# Patient Record
Sex: Female | Born: 1935 | Race: White | Hispanic: No | Marital: Married | Smoking: Never smoker
Health system: Southern US, Community
[De-identification: ages and names within clinical notes are randomized; demographics above are authoritative.]

## PROBLEM LIST (undated history)

## (undated) DIAGNOSIS — N189 Chronic kidney disease, unspecified: Secondary | ICD-10-CM

## (undated) DIAGNOSIS — K219 Gastro-esophageal reflux disease without esophagitis: Secondary | ICD-10-CM

## (undated) DIAGNOSIS — M1990 Unspecified osteoarthritis, unspecified site: Secondary | ICD-10-CM

## (undated) DIAGNOSIS — I10 Essential (primary) hypertension: Secondary | ICD-10-CM

## (undated) HISTORY — PX: CHOLECYSTECTOMY: SHX55

## (undated) HISTORY — PX: CATARACT EXTRACTION W/ INTRAOCULAR LENS  IMPLANT, BILATERAL: SHX1307

## (undated) HISTORY — PX: BACK SURGERY: SHX140

## (undated) HISTORY — PX: ABDOMINAL HYSTERECTOMY: SHX81

---

## 2004-11-18 ENCOUNTER — Ambulatory Visit (HOSPITAL_COMMUNITY): Admission: RE | Admit: 2004-11-18 | Discharge: 2004-11-19 | Payer: Self-pay | Admitting: Ophthalmology

## 2010-11-28 ENCOUNTER — Other Ambulatory Visit (HOSPITAL_COMMUNITY): Payer: Self-pay | Admitting: Neurosurgery

## 2010-11-28 ENCOUNTER — Encounter (HOSPITAL_COMMUNITY)
Admission: RE | Admit: 2010-11-28 | Discharge: 2010-11-28 | Disposition: A | Discharge status: Home / Self Care | Payer: Medicare Other | Source: Ambulatory Visit | Attending: Neurosurgery | Admitting: Neurosurgery

## 2010-11-28 ENCOUNTER — Ambulatory Visit (HOSPITAL_COMMUNITY)
Admission: RE | Admit: 2010-11-28 | Discharge: 2010-11-28 | Disposition: A | Discharge status: Home / Self Care | Payer: Medicare Other | Source: Ambulatory Visit | Attending: Neurosurgery | Admitting: Neurosurgery

## 2010-11-28 DIAGNOSIS — Z01810 Encounter for preprocedural cardiovascular examination: Secondary | ICD-10-CM | POA: Insufficient documentation

## 2010-11-28 DIAGNOSIS — Z01811 Encounter for preprocedural respiratory examination: Secondary | ICD-10-CM

## 2010-11-28 DIAGNOSIS — Z01818 Encounter for other preprocedural examination: Secondary | ICD-10-CM | POA: Insufficient documentation

## 2010-11-28 DIAGNOSIS — Z01812 Encounter for preprocedural laboratory examination: Secondary | ICD-10-CM | POA: Insufficient documentation

## 2010-11-28 LAB — SURGICAL PCR SCREEN: Staphylococcus aureus: NEGATIVE

## 2010-11-28 LAB — CBC
Platelets: 230 10*3/uL (ref 150–400)
RBC: 4.44 MIL/uL (ref 3.87–5.11)
RDW: 12.9 % (ref 11.5–15.5)
WBC: 9 10*3/uL (ref 4.0–10.5)

## 2010-11-28 LAB — ABO/RH: ABO/RH(D): O NEG

## 2010-11-28 LAB — BASIC METABOLIC PANEL
Chloride: 104 mEq/L (ref 96–112)
GFR calc Af Amer: 50 mL/min — ABNORMAL LOW (ref >60)
GFR calc non Af Amer: 41 mL/min — ABNORMAL LOW (ref >60)
Potassium: 3.5 mEq/L (ref 3.5–5.1)
Sodium: 138 mEq/L (ref 135–145)

## 2010-11-28 LAB — TYPE AND SCREEN
ABO/RH(D): O NEG
Antibody Screen: NEGATIVE

## 2010-12-02 ENCOUNTER — Inpatient Hospital Stay (HOSPITAL_COMMUNITY)
Admission: RE | Admit: 2010-12-02 | Discharge: 2010-12-05 | DRG: 460 | Disposition: A | Discharge status: Home / Self Care | Payer: Medicare Other | Source: Ambulatory Visit | Attending: Neurosurgery | Admitting: Neurosurgery

## 2010-12-02 ENCOUNTER — Inpatient Hospital Stay (HOSPITAL_COMMUNITY): Payer: Medicare Other

## 2010-12-02 DIAGNOSIS — S22009A Unspecified fracture of unspecified thoracic vertebra, initial encounter for closed fracture: Principal | ICD-10-CM | POA: Diagnosis present

## 2010-12-02 DIAGNOSIS — D649 Anemia, unspecified: Secondary | ICD-10-CM | POA: Diagnosis present

## 2010-12-02 DIAGNOSIS — X58XXXA Exposure to other specified factors, initial encounter: Secondary | ICD-10-CM | POA: Diagnosis present

## 2010-12-02 DIAGNOSIS — K219 Gastro-esophageal reflux disease without esophagitis: Secondary | ICD-10-CM | POA: Diagnosis present

## 2010-12-02 DIAGNOSIS — I10 Essential (primary) hypertension: Secondary | ICD-10-CM | POA: Diagnosis present

## 2010-12-02 DIAGNOSIS — Z8673 Personal history of transient ischemic attack (TIA), and cerebral infarction without residual deficits: Secondary | ICD-10-CM

## 2010-12-02 DIAGNOSIS — M129 Arthropathy, unspecified: Secondary | ICD-10-CM | POA: Diagnosis present

## 2010-12-11 NOTE — Op Note (Signed)
NAMEPRESLEY, GORA                ACCOUNT NO.:  [id number]  MEDICAL RECORD NO.:  [id number]           PATIENT TYPE:  I  LOCATION:  3035                         FACILITY:  [facility]  PHYSICIAN:  Reinaldo Meeker, M.D. DATE OF BIRTH:  [birth year]  DATE OF PROCEDURE:  12/02/2010 DATE OF DISCHARGE:                              OPERATIVE REPORT   PREOPERATIVE DIAGNOSIS:  Burst fracture T12 with retropulsion and stenosis, status post vertebroplasty.  POSTOPERATIVE DIAGNOSIS:  Burst fracture T12 with retropulsion and stenosis, status post vertebroplasty.  PROCEDURE:  T11-T12 decompressive laminectomy followed by T10, T11, L2 and L3 bilateral pedicle screw fixation followed by T10-L3 posterolateral fusion.  SURGEON:  Reinaldo Meeker, M.D.  ASSISTANT:  Donalee Citrin, M.D.  PROCEDURE IN DETAIL:  And placing in prone position, the patient's back was prepped and draped in usual sterile fashion.  Incision was made over the thoracolumbar junction, carried down the spinous processes. Subperiosteal dissection was then carried out bilaterally on the spinous processes and lamina and to the far-lateral region to identify transverse processes of the upper lumbar region and the rib heads of the lower thoracic region.  Fluoroscopy was brought into the field to identify the appropriate levels, and exposure was carried out at T10, T11, and T12 and L1, L2, and L3.  We then placed pedicle screws in T10 and T11 and L2 and L3 bilaterally.  We used AP fluoroscopy to choose entry points and passed the pedicle awl down through the pedicle without difficulty.  At T10, T11, and L3, we placed five 5 mm screws of a 45 mm length and tapped with the four 5-mm tap.  At L2, we tapped with a 3.75- mm tap and placed four 5 x 45-mm screw.  We then decompressed T11-12 in the midline and to the edge of the medial pedicles bilaterally until a good spinal decompression was carried out.  Large amounts of irrigation carried out  at this time.  High-speed drill was then used to decorticate the transverse processes and far-lateral region with rib heads, lateral facet joint.  I then did a posterolateral fusion along that area by placing a mixture of BMP, EquivaBone, and autologous bone which had been saved from the decompression.  We then used a template and chose an appropriate length rod.  We then probed in standard fashion and found it to be an excellent fit.  We then secured it with top loading nuts.  We did tightening and final tightening bilaterally.  At this time, large amounts of irrigation were carried out once more.  Gelfoam was placed over the dural exposure.  Fluoroscopy in AP and lateral directions showed excellent placement of screws and rods.  A cross-link was placed prior to placing our final shots.  Two epidural drains were left in the epidural space and brought out through separate stab wound incisions.  The wounds were then closed in multiple layers of Vicryl on the muscle fascia, subcutaneous and subcuticular tissues, and staples were placed on the skin.  Sterile dressing was then applied.  The patient was extubated, taken to the recovery room in stable condition.  ______________________________ Reinaldo Meeker, M.D.     ROK/MEDQ  D:  12/02/2010  T:  12/03/2010  Job:  [id number]  Electronically Signed by Aliene Beams M.D. on 12/11/2010 06:16:47 PM

## 2011-01-16 ENCOUNTER — Other Ambulatory Visit: Payer: Self-pay | Admitting: Neurosurgery

## 2011-01-16 ENCOUNTER — Ambulatory Visit
Admission: RE | Admit: 2011-01-16 | Discharge: 2011-01-16 | Disposition: A | Discharge status: Home / Self Care | Payer: Medicare Other | Source: Ambulatory Visit | Attending: Neurosurgery | Admitting: Neurosurgery

## 2011-01-16 DIAGNOSIS — M545 Low back pain: Secondary | ICD-10-CM

## 2011-01-16 DIAGNOSIS — S22009A Unspecified fracture of unspecified thoracic vertebra, initial encounter for closed fracture: Secondary | ICD-10-CM

## 2011-01-16 DIAGNOSIS — M549 Dorsalgia, unspecified: Secondary | ICD-10-CM

## 2011-01-21 ENCOUNTER — Other Ambulatory Visit: Payer: Self-pay | Admitting: Neurosurgery

## 2011-01-21 DIAGNOSIS — M545 Low back pain: Secondary | ICD-10-CM

## 2011-01-21 DIAGNOSIS — M549 Dorsalgia, unspecified: Secondary | ICD-10-CM

## 2011-01-23 ENCOUNTER — Ambulatory Visit
Admission: RE | Admit: 2011-01-23 | Discharge: 2011-01-23 | Disposition: A | Discharge status: Home / Self Care | Payer: Medicare Other | Source: Ambulatory Visit | Attending: Neurosurgery | Admitting: Neurosurgery

## 2011-01-23 ENCOUNTER — Other Ambulatory Visit: Payer: Medicare Other

## 2011-01-23 DIAGNOSIS — M549 Dorsalgia, unspecified: Secondary | ICD-10-CM

## 2011-01-23 DIAGNOSIS — M545 Low back pain: Secondary | ICD-10-CM

## 2011-02-19 NOTE — Discharge Summary (Signed)
  Sheila Hart, Sheila Hart                ACCOUNT NO.:  [id number]  MEDICAL RECORD NO.:  [id number]           PATIENT TYPE:  I  LOCATION:  3035                         FACILITY:  [facility]  PHYSICIAN:  Reinaldo Meeker, M.D. DATE OF BIRTH:  May 28, 1933  DATE OF ADMISSION:  12/02/2010 DATE OF DISCHARGE:  12/05/2010                              DISCHARGE SUMMARY   PRIMARY DIAGNOSIS:  Burst fracture T12 with stenosis.  PRIMARY OPERATIVE PROCEDURE:  T11-12 decompressive laminectomy followed by T10, T11, L2, and L3 bilateral pedicle screw fixation with T10-L3 posterolateral fusion.  HISTORY:  Ms. Woollard is a 77 year old female with severe back and bilateral lower extremity pain.  An MRI scan shows a burst fracture with retropulsion at T12 subsequent stenosis.  She is now admitted for a T11- 12 decompression with pedicle screw fixation.  She underwent the operation on December 02, 2010, and tolerated without difficulty.  She was able to increase her activity slowly and steadily.  By December 05, 2010, she was up in the halls ambulating well.  She has felt much better than she did before surgery.  She will be discharged home.  Discharge medications include pain medicine.  Her condition was markedly improved versus admission.          ______________________________ Reinaldo Meeker, M.D.     ROK/MEDQ  D:  01/13/2011  T:  01/14/2011  Job:  [id number]  Electronically Signed by Aliene Beams M.D. on 02/19/2011 05:00:56 PM

## 2011-02-23 ENCOUNTER — Ambulatory Visit
Admission: RE | Admit: 2011-02-23 | Discharge: 2011-02-23 | Disposition: A | Discharge status: Home / Self Care | Payer: Medicare Other | Source: Ambulatory Visit | Attending: Neurosurgery | Admitting: Neurosurgery

## 2011-02-23 ENCOUNTER — Other Ambulatory Visit: Payer: Self-pay | Admitting: Neurosurgery

## 2011-02-23 DIAGNOSIS — M549 Dorsalgia, unspecified: Secondary | ICD-10-CM

## 2011-02-23 DIAGNOSIS — M545 Low back pain: Secondary | ICD-10-CM

## 2011-06-15 ENCOUNTER — Other Ambulatory Visit: Payer: Self-pay | Admitting: Neurosurgery

## 2011-06-15 ENCOUNTER — Ambulatory Visit
Admission: RE | Admit: 2011-06-15 | Discharge: 2011-06-15 | Disposition: A | Discharge status: Home / Self Care | Payer: Medicare Other | Source: Ambulatory Visit | Attending: Neurosurgery | Admitting: Neurosurgery

## 2011-06-15 DIAGNOSIS — S22009A Unspecified fracture of unspecified thoracic vertebra, initial encounter for closed fracture: Secondary | ICD-10-CM

## 2011-06-15 DIAGNOSIS — M4800 Spinal stenosis, site unspecified: Secondary | ICD-10-CM

## 2011-06-17 ENCOUNTER — Ambulatory Visit
Admission: RE | Admit: 2011-06-17 | Discharge: 2011-06-17 | Disposition: A | Discharge status: Home / Self Care | Payer: Medicare Other | Source: Ambulatory Visit | Attending: Neurosurgery | Admitting: Neurosurgery

## 2011-06-17 DIAGNOSIS — M4800 Spinal stenosis, site unspecified: Secondary | ICD-10-CM

## 2011-06-17 MED ORDER — MEPERIDINE HCL 100 MG/ML IJ SOLN
75.0000 mg | Freq: Once | INTRAMUSCULAR | Status: AC
  Administered 2011-06-17: 75 mg via INTRAMUSCULAR

## 2011-06-17 MED ORDER — DIAZEPAM 2 MG PO TABS
5.0000 mg | ORAL_TABLET | Freq: Once | ORAL | Status: AC
  Administered 2011-06-17: 5 mg via ORAL

## 2011-06-17 MED ORDER — ONDANSETRON HCL 4 MG/2ML IJ SOLN
4.0000 mg | Freq: Once | INTRAMUSCULAR | Status: AC
  Administered 2011-06-17: 4 mg via INTRAMUSCULAR

## 2011-06-17 MED ORDER — IOHEXOL 180 MG/ML  SOLN
16.0000 mL | Freq: Once | INTRAMUSCULAR | Status: AC | PRN
  Administered 2011-06-17: 16 mL via INTRATHECAL

## 2011-07-17 ENCOUNTER — Encounter (HOSPITAL_COMMUNITY)
Admission: RE | Admit: 2011-07-17 | Discharge: 2011-07-17 | Disposition: A | Discharge status: Home / Self Care | Payer: Medicare Other | Source: Ambulatory Visit | Attending: Neurosurgery | Admitting: Neurosurgery

## 2011-07-17 LAB — CBC
HCT: 39.2 % (ref 36.0–46.0)
Hemoglobin: 12.8 g/dL (ref 12.0–15.0)
MCV: 87.7 fL (ref 78.0–100.0)
RBC: 4.47 MIL/uL (ref 3.87–5.11)
WBC: 6.2 10*3/uL (ref 4.0–10.5)

## 2011-07-17 LAB — BASIC METABOLIC PANEL
BUN: 22 mg/dL (ref 6–23)
CO2: 30 mEq/L (ref 19–32)
Chloride: 105 mEq/L (ref 96–112)
Creatinine, Ser: 1.23 mg/dL — ABNORMAL HIGH (ref 0.50–1.10)
Glucose, Bld: 99 mg/dL (ref 70–99)
Potassium: 4.2 mEq/L (ref 3.5–5.1)

## 2011-07-21 ENCOUNTER — Observation Stay (HOSPITAL_COMMUNITY)
Admission: RE | Admit: 2011-07-21 | Discharge: 2011-07-22 | Disposition: A | Discharge status: Home / Self Care | Payer: Medicare Other | Source: Ambulatory Visit | Attending: Neurosurgery | Admitting: Neurosurgery

## 2011-07-21 ENCOUNTER — Observation Stay (HOSPITAL_COMMUNITY): Payer: Medicare Other

## 2011-07-21 DIAGNOSIS — I10 Essential (primary) hypertension: Secondary | ICD-10-CM | POA: Insufficient documentation

## 2011-07-21 DIAGNOSIS — M47817 Spondylosis without myelopathy or radiculopathy, lumbosacral region: Principal | ICD-10-CM | POA: Insufficient documentation

## 2011-07-21 DIAGNOSIS — M5126 Other intervertebral disc displacement, lumbar region: Secondary | ICD-10-CM | POA: Insufficient documentation

## 2011-07-21 DIAGNOSIS — Z01812 Encounter for preprocedural laboratory examination: Secondary | ICD-10-CM | POA: Insufficient documentation

## 2011-07-21 DIAGNOSIS — Z8673 Personal history of transient ischemic attack (TIA), and cerebral infarction without residual deficits: Secondary | ICD-10-CM | POA: Insufficient documentation

## 2011-08-24 ENCOUNTER — Other Ambulatory Visit: Payer: Self-pay | Admitting: Neurosurgery

## 2011-08-24 DIAGNOSIS — M545 Low back pain, unspecified: Secondary | ICD-10-CM

## 2011-08-27 ENCOUNTER — Ambulatory Visit
Admission: RE | Admit: 2011-08-27 | Discharge: 2011-08-27 | Disposition: A | Discharge status: Home / Self Care | Payer: Medicare Other | Source: Ambulatory Visit | Attending: Neurosurgery | Admitting: Neurosurgery

## 2011-08-27 DIAGNOSIS — M545 Low back pain: Secondary | ICD-10-CM

## 2011-08-27 MED ORDER — GADOBENATE DIMEGLUMINE 529 MG/ML IV SOLN: 14.0000 mL | Freq: Once | INTRAVENOUS | Status: AC | PRN

## 2011-11-09 ENCOUNTER — Other Ambulatory Visit: Payer: Self-pay | Admitting: Neurosurgery

## 2011-11-09 DIAGNOSIS — M545 Low back pain: Secondary | ICD-10-CM

## 2011-11-11 ENCOUNTER — Ambulatory Visit
Admission: RE | Admit: 2011-11-11 | Discharge: 2011-11-11 | Disposition: A | Discharge status: Home / Self Care | Payer: Medicare Other | Source: Ambulatory Visit | Attending: Neurosurgery | Admitting: Neurosurgery

## 2011-11-11 VITALS — BP 159/82 | HR 75 | Ht 61.0 in | Wt 156.0 lb

## 2011-11-11 DIAGNOSIS — M545 Low back pain: Secondary | ICD-10-CM

## 2011-11-11 MED ORDER — DIAZEPAM 5 MG PO TABS
5.0000 mg | ORAL_TABLET | Freq: Once | ORAL | Status: AC
  Administered 2011-11-11: 5 mg via ORAL

## 2011-11-11 NOTE — Progress Notes (Signed)
Pt has been off sertraline for the past 2 days.

## 2011-11-11 NOTE — Discharge Instructions (Signed)
Myelogram  Discharge Instructions  1. Go home and rest quietly for the next 24 hours.  It is important to lie flat for the next 24 hours.  Get up only to go to the restroom.  You may lie in the bed or on a couch on your back, your stomach, your left side or your right side.  You may have one pillow under your head.  You may have pillows between your knees while you are on your side or under your knees while you are on your back.  2. DO NOT drive today.  Recline the seat as far back as it will go, while still wearing your seat belt, on the way home.  3. You may get up to go to the bathroom as needed.  You may sit up for 10 minutes to eat.  You may resume your normal diet and medications unless otherwise indicated.  Drink lots of extra fluids today and tomorrow.  4. The incidence of headache, nausea, or vomiting is about 5% (one in 20 patients).  If you develop a headache, lie flat and drink plenty of fluids until the headache goes away.  Caffeinated beverages may be helpful.  If you develop severe nausea and vomiting or a headache that does not go away with flat bed rest, call [phone].  5. You may resume normal activities after your 24 hours of bed rest is over; however, do not exert yourself strongly or do any heavy lifting tomorrow.  6. Call your physician for a follow-up appointment.  The results of your myelogram will be sent directly to your physician by the following day.  7. If you have any questions or if complications develop after you arrive home, please call [phone].  Discharge instructions have been explained to the patient.  The patient, or the person responsible for the patient, fully understands these instructions.   May resume sertraline on November 12, 2011, after 12:30 pm.

## 2012-02-25 ENCOUNTER — Encounter (HOSPITAL_COMMUNITY)
Admission: RE | Admit: 2012-02-25 | Discharge: 2012-02-25 | Disposition: A | Discharge status: Home / Self Care | Payer: Medicare Other | Source: Ambulatory Visit | Attending: Neurosurgery | Admitting: Neurosurgery

## 2012-02-25 ENCOUNTER — Ambulatory Visit (HOSPITAL_COMMUNITY)
Admission: RE | Admit: 2012-02-25 | Discharge: 2012-02-25 | Disposition: A | Discharge status: Home / Self Care | Payer: Medicare Other | Source: Ambulatory Visit | Attending: Neurosurgery | Admitting: Neurosurgery

## 2012-02-25 ENCOUNTER — Encounter (HOSPITAL_COMMUNITY): Payer: Self-pay | Admitting: Pharmacy Technician

## 2012-02-25 ENCOUNTER — Encounter (HOSPITAL_COMMUNITY): Payer: Self-pay

## 2012-02-25 DIAGNOSIS — Z01812 Encounter for preprocedural laboratory examination: Secondary | ICD-10-CM | POA: Insufficient documentation

## 2012-02-25 DIAGNOSIS — Z01818 Encounter for other preprocedural examination: Secondary | ICD-10-CM | POA: Insufficient documentation

## 2012-02-25 DIAGNOSIS — I10 Essential (primary) hypertension: Secondary | ICD-10-CM | POA: Insufficient documentation

## 2012-02-25 HISTORY — DX: Unspecified osteoarthritis, unspecified site: M19.90

## 2012-02-25 HISTORY — DX: Essential (primary) hypertension: I10

## 2012-02-25 HISTORY — DX: Gastro-esophageal reflux disease without esophagitis: K21.9

## 2012-02-25 HISTORY — DX: Chronic kidney disease, unspecified: N18.9

## 2012-02-25 LAB — BASIC METABOLIC PANEL
CO2: 29 mEq/L (ref 19–32)
Chloride: 102 mEq/L (ref 96–112)
Glucose, Bld: 112 mg/dL — ABNORMAL HIGH (ref 70–99)
Sodium: 141 mEq/L (ref 135–145)

## 2012-02-25 LAB — CBC
Hemoglobin: 13.6 g/dL (ref 12.0–15.0)
MCV: 90.6 fL (ref 78.0–100.0)
Platelets: 202 10*3/uL (ref 150–400)
RBC: 4.56 MIL/uL (ref 3.87–5.11)
WBC: 6.6 10*3/uL (ref 4.0–10.5)

## 2012-02-25 LAB — SURGICAL PCR SCREEN: MRSA, PCR: NEGATIVE

## 2012-02-25 NOTE — Progress Notes (Signed)
CALLED DR KRITZER'S OFFICE FOR PREOP ORDERS.

## 2012-02-25 NOTE — Pre-Procedure Instructions (Signed)
20 Sheila Hart  02/25/2012   Your procedure is scheduled on:  Tuesday 03/01/12   Report to [facility] at 530 AM.  Call this number if you have problems the morning of surgery: [phone]   Remember:   Do not eat food:After Midnight.  May have clear liquids: up to 4 Hours before arrival.(UNTIL 130AM)  Clear liquids include soda, tea, black coffee, apple or grape juice, broth.  Take these medicines the morning of surgery with A SIP OF WATER:  POTASSIUM , ZOLOFT   Do not wear jewelry, make-up or nail polish.  Do not wear lotions, powders, or perfumes. You may wear deodorant.  Do not shave 48 hours prior to surgery. Men may shave face and neck.  Do not bring valuables to the hospital.  Contacts, dentures or bridgework may not be worn into surgery.  Leave suitcase in the car. After surgery it may be brought to your room.  For patients admitted to the hospital, checkout time is 11:00 AM the day of discharge.   Patients discharged the day of surgery will not be allowed to drive home.  Name and phone number of your driver:  Special Instructions: CHG Shower Use Special Wash: 1/2 bottle night before surgery and 1/2 bottle morning of surgery.   Please read over the following fact sheets that you were given: Pain Booklet, Coughing and Deep Breathing, MRSA Information and Surgical Site Infection Prevention

## 2012-02-26 NOTE — Progress Notes (Signed)
Called [facility] Noe about orders not being signed yet and needing a consent order entered in EPIC.

## 2012-02-29 NOTE — Progress Notes (Signed)
Called [facility] and left message on voice mail for Shanda Bumps that pt's orders need to be signed.

## 2012-03-01 ENCOUNTER — Encounter (HOSPITAL_COMMUNITY): Payer: Self-pay | Admitting: Anesthesiology

## 2012-03-01 ENCOUNTER — Ambulatory Visit (HOSPITAL_COMMUNITY): Payer: Medicare Other

## 2012-03-01 ENCOUNTER — Encounter (HOSPITAL_COMMUNITY): Payer: Self-pay

## 2012-03-01 ENCOUNTER — Encounter (HOSPITAL_COMMUNITY)
Admission: RE | Disposition: A | Discharge status: Home / Self Care | Payer: Self-pay | Source: Ambulatory Visit | Attending: Neurosurgery

## 2012-03-01 ENCOUNTER — Inpatient Hospital Stay (HOSPITAL_COMMUNITY)
Admission: RE | Admit: 2012-03-01 | Discharge: 2012-03-02 | DRG: 491 | Disposition: A | Discharge status: Home / Self Care | Payer: Medicare Other | Source: Ambulatory Visit | Attending: Neurosurgery | Admitting: Neurosurgery

## 2012-03-01 ENCOUNTER — Ambulatory Visit (HOSPITAL_COMMUNITY): Payer: Medicare Other | Admitting: Anesthesiology

## 2012-03-01 DIAGNOSIS — N189 Chronic kidney disease, unspecified: Secondary | ICD-10-CM | POA: Diagnosis present

## 2012-03-01 DIAGNOSIS — M47817 Spondylosis without myelopathy or radiculopathy, lumbosacral region: Principal | ICD-10-CM | POA: Diagnosis present

## 2012-03-01 DIAGNOSIS — Z981 Arthrodesis status: Secondary | ICD-10-CM

## 2012-03-01 DIAGNOSIS — I129 Hypertensive chronic kidney disease with stage 1 through stage 4 chronic kidney disease, or unspecified chronic kidney disease: Secondary | ICD-10-CM | POA: Diagnosis present

## 2012-03-01 DIAGNOSIS — M549 Dorsalgia, unspecified: Secondary | ICD-10-CM

## 2012-03-01 HISTORY — PX: HARDWARE REMOVAL: SHX979

## 2012-03-01 SURGERY — REMOVAL, HARDWARE
Anesthesia: General

## 2012-03-01 MED ORDER — TRIAMTERENE-HCTZ 37.5-25 MG PO TABS
1.0000 | ORAL_TABLET | Freq: Every day | ORAL | Status: DC
  Administered 2012-03-01 – 2012-03-02 (×2): 1 via ORAL
  Filled 2012-03-01 (×2): qty 1

## 2012-03-01 MED ORDER — GLYCOPYRROLATE 0.2 MG/ML IJ SOLN
INTRAMUSCULAR | Status: DC | PRN
  Administered 2012-03-01: 0.4 mg via INTRAVENOUS
  Administered 2012-03-01: 0.3 mg via INTRAVENOUS

## 2012-03-01 MED ORDER — HYDROMORPHONE HCL PF 1 MG/ML IJ SOLN
INTRAMUSCULAR | Status: AC
  Filled 2012-03-01: qty 1

## 2012-03-01 MED ORDER — SODIUM CHLORIDE 0.9 % IV SOLN
INTRAVENOUS | Status: AC
  Filled 2012-03-01: qty 500

## 2012-03-01 MED ORDER — HYDROMORPHONE HCL PF 1 MG/ML IJ SOLN: 1.0000 mg | INTRAMUSCULAR | Status: DC | PRN

## 2012-03-01 MED ORDER — VANCOMYCIN HCL 500 MG IV SOLR
500.0000 mg | Freq: Once | INTRAVENOUS | Status: DC
  Filled 2012-03-01: qty 500

## 2012-03-01 MED ORDER — BACITRACIN ZINC 500 UNIT/GM EX OINT
TOPICAL_OINTMENT | CUTANEOUS | Status: DC | PRN
  Administered 2012-03-01: 1 via TOPICAL

## 2012-03-01 MED ORDER — SODIUM CHLORIDE 0.9 % IJ SOLN: 3.0000 mL | Freq: Two times a day (BID) | INTRAMUSCULAR | Status: DC

## 2012-03-01 MED ORDER — ACETAMINOPHEN 10 MG/ML IV SOLN: 1000.0000 mg | Freq: Four times a day (QID) | INTRAVENOUS | Status: DC

## 2012-03-01 MED ORDER — ACETAMINOPHEN 325 MG PO TABS: 650.0000 mg | ORAL_TABLET | ORAL | Status: DC | PRN

## 2012-03-01 MED ORDER — FENTANYL CITRATE 0.05 MG/ML IJ SOLN
INTRAMUSCULAR | Status: DC | PRN
  Administered 2012-03-01 (×2): 50 ug via INTRAVENOUS
  Administered 2012-03-01: 100 ug via INTRAVENOUS

## 2012-03-01 MED ORDER — EPHEDRINE SULFATE 50 MG/ML IJ SOLN
INTRAMUSCULAR | Status: DC | PRN
  Administered 2012-03-01 (×5): 5 mg via INTRAVENOUS

## 2012-03-01 MED ORDER — POTASSIUM CHLORIDE CRYS ER 10 MEQ PO TBCR
10.0000 meq | EXTENDED_RELEASE_TABLET | Freq: Every day | ORAL | Status: DC
  Administered 2012-03-01 – 2012-03-02 (×2): 10 meq via ORAL
  Filled 2012-03-01 (×2): qty 1

## 2012-03-01 MED ORDER — MIDAZOLAM HCL 2 MG/2ML IJ SOLN
INTRAMUSCULAR | Status: AC
  Administered 2012-03-01: 1 mg
  Filled 2012-03-01: qty 2

## 2012-03-01 MED ORDER — BACITRACIN 50000 UNITS IM SOLR
INTRAMUSCULAR | Status: AC
  Filled 2012-03-01: qty 1

## 2012-03-01 MED ORDER — HEMOSTATIC AGENTS (NO CHARGE) OPTIME
TOPICAL | Status: DC | PRN
  Administered 2012-03-01: 1 via TOPICAL

## 2012-03-01 MED ORDER — LIDOCAINE HCL (CARDIAC) 20 MG/ML IV SOLN
INTRAVENOUS | Status: DC | PRN
  Administered 2012-03-01: 40 mg via INTRAVENOUS

## 2012-03-01 MED ORDER — LACTATED RINGERS IV SOLN
INTRAVENOUS | Status: DC | PRN
  Administered 2012-03-01 (×3): via INTRAVENOUS

## 2012-03-01 MED ORDER — VANCOMYCIN HCL 1000 MG IV SOLR
750.0000 mg | Freq: Two times a day (BID) | INTRAVENOUS | Status: DC
  Administered 2012-03-01 – 2012-03-02 (×2): 750 mg via INTRAVENOUS
  Filled 2012-03-01 (×3): qty 750

## 2012-03-01 MED ORDER — ONDANSETRON HCL 4 MG/2ML IJ SOLN: 4.0000 mg | INTRAMUSCULAR | Status: DC | PRN

## 2012-03-01 MED ORDER — ACETAMINOPHEN 650 MG RE SUPP: 650.0000 mg | RECTAL | Status: DC | PRN

## 2012-03-01 MED ORDER — SERTRALINE HCL 100 MG PO TABS
100.0000 mg | ORAL_TABLET | Freq: Every day | ORAL | Status: DC
  Administered 2012-03-01 – 2012-03-02 (×2): 100 mg via ORAL
  Filled 2012-03-01 (×2): qty 1

## 2012-03-01 MED ORDER — ONDANSETRON HCL 4 MG/2ML IJ SOLN
INTRAMUSCULAR | Status: DC | PRN
  Administered 2012-03-01: 4 mg via INTRAVENOUS

## 2012-03-01 MED ORDER — SODIUM CHLORIDE 0.9 % IR SOLN
Status: DC | PRN
  Administered 2012-03-01: 08:00:00

## 2012-03-01 MED ORDER — HYDROMORPHONE HCL PF 1 MG/ML IJ SOLN
0.2500 mg | INTRAMUSCULAR | Status: DC | PRN
  Administered 2012-03-01 (×4): 0.5 mg via INTRAVENOUS

## 2012-03-01 MED ORDER — PHENOL 1.4 % MT LIQD: 1.0000 | OROMUCOSAL | Status: DC | PRN

## 2012-03-01 MED ORDER — KCL IN DEXTROSE-NACL 20-5-0.45 MEQ/L-%-% IV SOLN
80.0000 mL/h | INTRAVENOUS | Status: DC
  Administered 2012-03-01: 80 mL/h via INTRAVENOUS
  Filled 2012-03-01 (×3): qty 1000

## 2012-03-01 MED ORDER — NEOSTIGMINE METHYLSULFATE 1 MG/ML IJ SOLN
INTRAMUSCULAR | Status: DC | PRN
  Administered 2012-03-01: 2 mg via INTRAVENOUS
  Administered 2012-03-01: 3 mg via INTRAVENOUS

## 2012-03-01 MED ORDER — ONDANSETRON HCL 4 MG/2ML IJ SOLN: 4.0000 mg | Freq: Once | INTRAMUSCULAR | Status: DC | PRN

## 2012-03-01 MED ORDER — ROCURONIUM BROMIDE 100 MG/10ML IV SOLN
INTRAVENOUS | Status: DC | PRN
  Administered 2012-03-01: 50 mg via INTRAVENOUS

## 2012-03-01 MED ORDER — 0.9 % SODIUM CHLORIDE (POUR BTL) OPTIME
TOPICAL | Status: DC | PRN
  Administered 2012-03-01: 1000 mL

## 2012-03-01 MED ORDER — MENTHOL 3 MG MT LOZG: 1.0000 | LOZENGE | OROMUCOSAL | Status: DC | PRN

## 2012-03-01 MED ORDER — VANCOMYCIN HCL 1000 MG IV SOLR
1000.0000 mg | INTRAVENOUS | Status: DC | PRN
  Administered 2012-03-01: 500 mg via INTRAVENOUS

## 2012-03-01 MED ORDER — THROMBIN 5000 UNITS EX SOLR
CUTANEOUS | Status: DC | PRN
  Administered 2012-03-01 (×2): 5000 [IU] via TOPICAL

## 2012-03-01 MED ORDER — SODIUM CHLORIDE 0.9 % IV SOLN: 250.0000 mL | INTRAVENOUS | Status: DC

## 2012-03-01 MED ORDER — MIDAZOLAM HCL 2 MG/2ML IJ SOLN: 1.0000 mg | Freq: Once | INTRAMUSCULAR | Status: DC

## 2012-03-01 MED ORDER — SODIUM CHLORIDE 0.9 % IV SOLN
INTRAVENOUS | Status: DC | PRN
  Administered 2012-03-01: 08:00:00 via INTRAVENOUS

## 2012-03-01 MED ORDER — HYDROCODONE-ACETAMINOPHEN 5-325 MG PO TABS
1.0000 | ORAL_TABLET | ORAL | Status: DC | PRN
  Administered 2012-03-01 – 2012-03-02 (×3): 2 via ORAL
  Filled 2012-03-01 (×3): qty 2

## 2012-03-01 MED ORDER — ACETAMINOPHEN 10 MG/ML IV SOLN
INTRAVENOUS | Status: AC
  Administered 2012-03-01: 1000 mg
  Filled 2012-03-01: qty 100

## 2012-03-01 MED ORDER — SODIUM CHLORIDE 0.9 % IJ SOLN: 3.0000 mL | INTRAMUSCULAR | Status: DC | PRN

## 2012-03-01 MED ORDER — PROPOFOL 10 MG/ML IV EMUL
INTRAVENOUS | Status: DC | PRN
  Administered 2012-03-01: 120 mg via INTRAVENOUS

## 2012-03-01 SURGICAL SUPPLY — 44 items
BAG DECANTER FOR FLEXI CONT (MISCELLANEOUS) ×2 IMPLANT
BENZOIN TINCTURE PRP APPL 2/3 (GAUZE/BANDAGES/DRESSINGS) ×2 IMPLANT
BLADE SURG ROTATE 9660 (MISCELLANEOUS) ×2 IMPLANT
BRUSH SCRUB EZ PLAIN DRY (MISCELLANEOUS) ×2 IMPLANT
CANISTER SUCTION 2500CC (MISCELLANEOUS) ×2 IMPLANT
CLOTH BEACON ORANGE TIMEOUT ST (SAFETY) ×2 IMPLANT
CONT SPEC 4OZ CLIKSEAL STRL BL (MISCELLANEOUS) ×2 IMPLANT
DERMABOND ADVANCED (GAUZE/BANDAGES/DRESSINGS) ×1
DERMABOND ADVANCED .7 DNX12 (GAUZE/BANDAGES/DRESSINGS) ×1 IMPLANT
DRAPE LAPAROTOMY 100X72X124 (DRAPES) ×2 IMPLANT
DRAPE MICROSCOPE ZEISS OPMI (DRAPES) IMPLANT
DRAPE SURG 17X23 STRL (DRAPES) ×4 IMPLANT
DRESSING TELFA 8X3 (GAUZE/BANDAGES/DRESSINGS) ×2 IMPLANT
ELECT REM PT RETURN 9FT ADLT (ELECTROSURGICAL) ×2
ELECTRODE REM PT RTRN 9FT ADLT (ELECTROSURGICAL) ×1 IMPLANT
GAUZE SPONGE 4X4 16PLY XRAY LF (GAUZE/BANDAGES/DRESSINGS) IMPLANT
GLOVE BIOGEL PI IND STRL 7.0 (GLOVE) ×2 IMPLANT
GLOVE BIOGEL PI INDICATOR 7.0 (GLOVE) ×2
GLOVE ECLIPSE 7.5 STRL STRAW (GLOVE) ×2 IMPLANT
GLOVE INDICATOR 7.0 STRL GRN (GLOVE) ×2 IMPLANT
GOWN BRE IMP SLV AUR LG STRL (GOWN DISPOSABLE) ×2 IMPLANT
GOWN BRE IMP SLV AUR XL STRL (GOWN DISPOSABLE) ×4 IMPLANT
GOWN STRL REIN 2XL LVL4 (GOWN DISPOSABLE) IMPLANT
KIT BASIN OR (CUSTOM PROCEDURE TRAY) ×2 IMPLANT
KIT ROOM TURNOVER OR (KITS) ×2 IMPLANT
NEEDLE HYPO 22GX1.5 SAFETY (NEEDLE) ×2 IMPLANT
NEEDLE SPNL 22GX3.5 QUINCKE BK (NEEDLE) ×2 IMPLANT
NS IRRIG 1000ML POUR BTL (IV SOLUTION) ×2 IMPLANT
PACK LAMINECTOMY NEURO (CUSTOM PROCEDURE TRAY) ×2 IMPLANT
PAD ARMBOARD 7.5X6 YLW CONV (MISCELLANEOUS) ×6 IMPLANT
PATTIES SURGICAL .75X.75 (GAUZE/BANDAGES/DRESSINGS) ×2 IMPLANT
RUBBERBAND STERILE (MISCELLANEOUS) IMPLANT
SPONGE GAUZE 4X4 12PLY (GAUZE/BANDAGES/DRESSINGS) ×2 IMPLANT
SPONGE SURGIFOAM ABS GEL SZ50 (HEMOSTASIS) ×2 IMPLANT
STRIP CLOSURE SKIN 1/2X4 (GAUZE/BANDAGES/DRESSINGS) ×6 IMPLANT
SUT VIC AB 0 CT1 18XCR BRD8 (SUTURE) ×1 IMPLANT
SUT VIC AB 0 CT1 8-18 (SUTURE) ×1
SUT VIC AB 2-0 OS6 18 (SUTURE) ×8 IMPLANT
SUT VIC AB 3-0 CP2 18 (SUTURE) ×4 IMPLANT
SYR 20ML ECCENTRIC (SYRINGE) ×2 IMPLANT
TOOL FLUTED BALL 7MM (MISCELLANEOUS) ×2 IMPLANT
TOWEL OR 17X24 6PK STRL BLUE (TOWEL DISPOSABLE) ×2 IMPLANT
TOWEL OR 17X26 10 PK STRL BLUE (TOWEL DISPOSABLE) ×2 IMPLANT
WATER STERILE IRR 1000ML POUR (IV SOLUTION) ×2 IMPLANT

## 2012-03-01 NOTE — Preoperative (Signed)
Beta Blockers   Reason not to administer Beta Blockers:Not Applicable 

## 2012-03-01 NOTE — Op Note (Signed)
Preop diagnosis: Back pain status post spinal instrumentation and spondylosis L3-4 left with L4 nerve root compression Postop diagnosis: Same Procedure: Exploration of thoracolumbar fusion followed by removal of thoracolumbar pedicle screw instrumentation T9-L3 followed by left L3-4 intralaminar laminotomy for decompression of L4 nerve root Surgeon: Insurance risk surveyor: Botero  After and placed the prone position the patient's back was prepped and draped in the usual sterile fashion. Previous lumbar and thoracic incision were opened. It was taken down to the dorsal fascia and then the dorsal fascia was incised bilaterally over the spinal instrumentation. Once this was cleared of soft tissue and expose clearly the cross-link was removed. We then evaluated the fusion across the thoracolumbar junction and found to be solid. We then removed the locking caps rod and screws bilaterally without difficulty. We then did subperiosteal dissection on the left side of the spinous processes lamina and facet joint at L3-4 on the left. We identified the edges of the previous laminotomy and generously enlarged M. with a high-speed drill and bony punches. We remove soft tissue from the area until the spinal dura and L4 nerve were well visualized well decompressed. At this time large amounts of irrigation carried out and any bleeding control proper coagulation Gelfoam. An epidural drain was left in the epidural space and brought out through a separate stab wound incision. The was then closed in multiple layers of Vicryl on the muscle fascia subcutaneous and subcuticular tissues and Dermabond Steri-Strips were placed on the skin. A sterile dressing was applied and the patient was extubated and taken to recovery room in stable condition.

## 2012-03-01 NOTE — Progress Notes (Signed)
ANTIBIOTIC CONSULT NOTE - INITIAL  Pharmacy Consult for Vancomycin Indication: Surgical prophylaxis  Allergies  Allergen Reactions  . Avelox (Moxifloxacin Hcl In Nacl) Hives and Itching  . Cefaclor Itching and Rash  . Clindamycin/Lincomycin Hives and Rash  . Codeine Nausea And Vomiting  . Guaifenesin Hives and Itching  . Lodine (Etodolac) Nausea And Vomiting  . Lovastatin Other (See Comments)    Causes body aches  . Penicillins Hives and Rash  . Tequin Hives and Itching  . Verapamil Hcl Er Itching and Rash    Patient Measurements: Height: 5\' 1"  (154.9 cm) Weight: 167 lb 8 oz (75.978 kg) IBW/kg (Calculated) : 47.8   Vital Signs: Temp: 97.6 F (36.4 C) (06/11 1240) Temp src: Axillary (06/11 1240) BP: 128/80 mmHg (06/11 1240) Pulse Rate: 85  (06/11 1205) Intake/Output from previous day:   Intake/Output from this shift: Total I/O In: 2025 [I.V.:2025] Out: 250 [Drains:50; Blood:200]  Labs: No results found for this basename: WBC:3,HGB:3,PLT:3,LABCREA:3,CREATININE:3 in the last 72 hours Estimated Creatinine Clearance: 52.3 ml/min (by C-G formula based on Cr of 0.84). No results found for this basename: VANCOTROUGH:2,VANCOPEAK:2,VANCORANDOM:2,GENTTROUGH:2,GENTPEAK:2,GENTRANDOM:2,TOBRATROUGH:2,TOBRAPEAK:2,TOBRARND:2,AMIKACINPEAK:2,AMIKACINTROU:2,AMIKACIN:2, in the last 72 hours   Microbiology: Recent Results (from the past 720 hour(s))  SURGICAL PCR SCREEN     Status: Normal   Collection Time   02/25/12  3:12 PM      Component Value Range Status Comment   MRSA, PCR NEGATIVE  NEGATIVE  Final    Staphylococcus aureus NEGATIVE  NEGATIVE  Final     Medical History: Past Medical History  Diagnosis Date  . Hypertension   . Chronic kidney disease     HX KIDNEY STONES   . GERD (gastroesophageal reflux disease)   . Arthritis     OA    Assessment: 40 YOF s/p exploration of thoracolumbar fusion followed by hardware removal. Pharmacy is consulted to dose vancomycin for  surgical prophylaxis. Pt. Has a hemovac, scr 0.84. Est. crcl 50, received pre-op vancomycin 500mg  at 0800   Goal of Therapy:  Vancomycin trough level 15-20 mcg/ml  Plan:  - Vancomycin 750mg  IV Q 12hrs start 2000 - f/u renal function, drain status - Check vancomycin trough at steady state if continues  Bayard Hugger, PharmD, BCPS  Clinical Pharmacist  Pager: [phone]  03/01/2012,1:01 PM

## 2012-03-01 NOTE — Progress Notes (Signed)
UR COMPLETED  

## 2012-03-01 NOTE — Anesthesia Preprocedure Evaluation (Addendum)
Anesthesia Evaluation  Patient identified by MRN, date of birth, ID band Patient awake    Reviewed: Allergy & Precautions, H&P , NPO status , Patient's Chart, lab work & pertinent test results  Airway Mallampati: II TM Distance: >3 FB Neck ROM: Full    Dental  (+) Teeth Intact   Pulmonary neg pulmonary ROS,  breath sounds clear to auscultation        Cardiovascular hypertension, Pt. on medications Rhythm:Regular Rate:Normal     Neuro/Psych negative neurological ROS  negative psych ROS   GI/Hepatic Neg liver ROS, GERD-  Controlled,  Endo/Other  negative endocrine ROS  Renal/GU      Musculoskeletal negative musculoskeletal ROS (+)   Abdominal   Peds  Hematology negative hematology ROS (+)   Anesthesia Other Findings   Reproductive/Obstetrics                         Anesthesia Physical Anesthesia Plan  ASA: II  Anesthesia Plan: General   Post-op Pain Management:    Induction: Intravenous  Airway Management Planned: Oral ETT  Additional Equipment:   Intra-op Plan:   Post-operative Plan: Extubation in OR  Informed Consent: I have reviewed the patients History and Physical, chart, labs and discussed the procedure including the risks, benefits and alternatives for the proposed anesthesia with the patient or authorized representative who has indicated his/her understanding and acceptance.   Dental advisory given  Plan Discussed with: Anesthesiologist and Surgeon  Anesthesia Plan Comments: (Htn Mild obesity Painful retained hardware  Plan GA  Kipp Brood, MD)       Anesthesia Quick Evaluation

## 2012-03-01 NOTE — Brief Op Note (Signed)
03/01/2012  10:44 AM  PATIENT:  Sheila Hart  78 y.o. female  PRE-OPERATIVE DIAGNOSIS:  back pain  POST-OPERATIVE DIAGNOSIS:  back pain  PROCEDURE:  Procedure(s) (LRB): HARDWARE REMOVAL (N/A)  SURGEON:  Surgeon(s) and Role:    * Reinaldo Meeker, MD - Primary    * Karn Cassis, MD - Assisting  PHYSICIAN ASSISTANT:   ASSISTANTS: Botero   ANESTHESIA:   general  EBL:  Total I/O In: 2000 [I.V.:2000] Out: 200 [Blood:200]  BLOOD ADMINISTERED:none  DRAINS: (medium) Hemovact drain(s) in the epidural with  Suction Open   LOCAL MEDICATIONS USED:  NONE  SPECIMEN:  No Specimen  DISPOSITION OF SPECIMEN:  N/A  COUNTS:  YES  TOURNIQUET:  * No tourniquets in log *  DICTATION: .Dragon Dictation  PLAN OF CARE: Admit to inpatient   PATIENT DISPOSITION:  PACU - hemodynamically stable.   Delay start of Pharmacological VTE agent (>24hrs) due to surgical blood loss or risk of bleeding: yes

## 2012-03-01 NOTE — H&P (Signed)
Sheila Hart is an 78 y.o. female.   Chief Complaint: Back pain and left leg pain HPI: The patient is a 78 year old female who had a compression fracture at the thoracolumbar junction years ago. She was treated with a vertebroplasty in spite of gross instability and bone and the canal. When she came to see [place] to 7 severe back pain and underwent a long segment fusion for approximately T9-L3. She got marked improvement. Because of the weakness of her bone and the long segment of the fusion to the upper screws slowly worked backward in spite of her fusing nicely. She did develop some mild stenosis at L3-4 below the fusion and underwent a laminotomy last year. She initially did well but then developed some recurrent pain at that level. Been restudied and discussing the options it was elected to remove her hardware and some of it was pushing upward on the skin from underneath. We're also going to reevaluate the left L3-4 foramen. I had a long discussion with her regarding the risks and benefits of surgical intervention. Discussed include but are not limited to bleeding infection weakness numbness paralysis no improvement in her pain, and death. We have discussed alternative methods of therapy offered risks and benefits of nonintervention. She's had the opportunity numerous questions and appears to understand. With this information in hand she has requested that we proceed with surgery.  Past Medical History  Diagnosis Date  . Hypertension   . Chronic kidney disease     HX KIDNEY STONES   . GERD (gastroesophageal reflux disease)   . Arthritis     OA    Past Surgical History  Procedure Date  . Abdominal hysterectomy   . Cholecystectomy   . Cataract extraction w/ intraocular lens  implant, bilateral   . Back surgery     2011(BROKEN) ,  ???2012    No family history on file. Social History:  reports that she has never smoked. She has never used smokeless tobacco. She reports that she does not drink  alcohol or use illicit drugs.  Allergies:  Allergies  Allergen Reactions  . Avelox (Moxifloxacin Hcl In Nacl) Hives and Itching  . Cefaclor Itching and Rash  . Clindamycin/Lincomycin Hives and Rash  . Codeine Nausea And Vomiting  . Guaifenesin Hives and Itching  . Lodine (Etodolac) Nausea And Vomiting  . Lovastatin Other (See Comments)    Causes body aches  . Penicillins Hives and Rash  . Tequin Hives and Itching  . Verapamil Hcl Er Itching and Rash    Medications Prior to Admission  Medication Sig Dispense Refill  . calcium-vitamin D (OSCAL WITH D) 500-200 MG-UNIT per tablet Take 1 tablet by mouth daily.      . cholecalciferol (VITAMIN D) 1000 UNITS tablet Take 1,000 Units by mouth daily.      . potassium chloride (K-DUR,KLOR-CON) 10 MEQ tablet Take 10 mEq by mouth daily.      . sertraline (ZOLOFT) 100 MG tablet Take 100 mg by mouth daily.      Marland Kitchen triamterene-hydrochlorothiazide (MAXZIDE-25) 37.5-25 MG per tablet Take 1 tablet by mouth daily.      . vitamin B-12 (CYANOCOBALAMIN) 1000 MCG tablet Take 1,000 mcg by mouth daily.        No results found for this or any previous visit (from the past 48 hour(s)). No results found.  A comprehensive review of systems was negative.  Blood pressure 131/80, pulse 76, temperature 98.2 F (36.8 C), temperature source Oral, resp. rate 16,  SpO2 98.00%.  The patient is awake alert and oriented. His no facial asymmetry. Her gait is slow and deliberate. Reflexes are decreased but equal her strength is intact as is her sensation. Assessment/Plan Impression is that of back pain and left leg pain and the plan is for removal of her hardware expiration of the left L3-4 foramen.  Reinaldo Meeker, MD 03/01/2012, 7:37 AM

## 2012-03-01 NOTE — Anesthesia Procedure Notes (Signed)
Procedure Name: Intubation Date/Time: 03/01/2012 8:07 AM Performed by: Julianne Rice K Pre-anesthesia Checklist: Emergency Drugs available, Patient identified, Timeout performed, Suction available and Patient being monitored Patient Re-evaluated:Patient Re-evaluated prior to inductionOxygen Delivery Method: Circle system utilized Intubation Type: IV induction Ventilation: Mask ventilation without difficulty Laryngoscope Size: Miller and 2 Grade View: Grade II Tube type: Oral Tube size: 7.5 mm Number of attempts: 1 Airway Equipment and Method: Stylet Placement Confirmation: ETT inserted through vocal cords under direct vision,  positive ETCO2 and breath sounds checked- equal and bilateral Secured at: 22 cm Tube secured with: Tape Dental Injury: Teeth and Oropharynx as per pre-operative assessment

## 2012-03-01 NOTE — Progress Notes (Signed)
Paged Dr Gerlene Fee at [phone] as there are no orders for permit.  No response as of yet.  Will mark pt ready for surgery

## 2012-03-01 NOTE — Anesthesia Postprocedure Evaluation (Signed)
  Anesthesia Post-op Note  Patient: Sheila Hart  Procedure(s) Performed: Procedure(s) (LRB): HARDWARE REMOVAL (N/A)  Patient Location: PACU  Anesthesia Type: General  Level of Consciousness: awake, alert  and oriented  Airway and Oxygen Therapy: Patient Spontanous Breathing and Patient connected to nasal cannula oxygen  Post-op Pain: mild  Post-op Assessment: Post-op Vital signs reviewed and Patient's Cardiovascular Status Stable  Post-op Vital Signs: stable  Complications: No apparent anesthesia complications

## 2012-03-01 NOTE — Transfer of Care (Signed)
Immediate Anesthesia Transfer of Care Note  Patient: Sheila Hart  Procedure(s) Performed: Procedure(s) (LRB): HARDWARE REMOVAL (N/A)  Patient Location: PACU  Anesthesia Type: General  Level of Consciousness: awake, alert  and oriented  Airway & Oxygen Therapy: Patient Spontanous Breathing and Patient connected to nasal cannula oxygen  Post-op Assessment: Report given to PACU RN and Post -op Vital signs reviewed and stable  Post vital signs: Reviewed and stable  Complications: No apparent anesthesia complications

## 2012-03-02 MED ORDER — HYDROCODONE-ACETAMINOPHEN 5-325 MG PO TABS: 1.0000 | ORAL_TABLET | ORAL | Status: AC | PRN

## 2012-03-02 NOTE — Progress Notes (Signed)
PT was walked this morning prior to d/c walked down the circle and hall with a Guice, no distress. Pt was d/c with instructions. Patient stable

## 2012-03-02 NOTE — Care Management Note (Signed)
    Page 1 of 1   03/02/2012     10:30:54 AM   CARE MANAGEMENT NOTE 03/02/2012  Patient:  Sheila Hart, Sheila Hart   Account Number:  [id number]  Date Initiated:  03/02/2012  Documentation initiated by:  Onnie Boer  Subjective/Objective Assessment:   PT WAS ADMITTED FOR SURG     Action/Plan:   PROGRESSION OF CARE AND DISCHARGE PLANNING   Anticipated DC Date:  03/02/2012   Anticipated DC Plan:  HOME/SELF CARE      DC Planning Services  CM consult      Choice offered to / List presented to:             Status of service:  Completed, signed off Medicare Important Message given?   (If response is "NO", the following Medicare IM given date fields will be blank) Date Medicare IM given:   Date Additional Medicare IM given:    Discharge Disposition:  HOME/SELF CARE  Per UR Regulation:  Reviewed for med. necessity/level of care/duration of stay  If discussed at Long Length of Stay Meetings, dates discussed:    Comments:  03/02/12 Onnie Boer, RN, BSN 1029 PT WAS ADMITTED FOR HARDWEAR REMOVAL AND WAS DC'D TO HOME WITH SELF CARE

## 2012-03-02 NOTE — Discharge Summary (Signed)
Physician Discharge Summary  Patient ID: Sheila Hart MRN: [id number] DOB/AGE: [birth year] 76 y.o.  Admit date: 03/01/2012 Discharge date: 03/02/2012  Admission Diagnoses:  Discharge Diagnoses:  Active Problems:  * No active hospital problems. *    Discharged Condition: good  Hospital Course: Surgery one day, home the next. Pain markedly improved. Wound looks excellent. Instructions given.  Consults: None  Significant Diagnostic Studies: none  Treatments: surgery: Removal of hardware, Left L34 laminotomy  Discharge Exam: Blood pressure 111/70, pulse 74, temperature 97.6 F (36.4 C), temperature source Oral, resp. rate 20, height 5\' 1"  (1.549 m), weight 75.978 kg (167 lb 8 oz), SpO2 98.00%. Incision/Wound:healing well  Disposition: 01-Home or Self Care  Discharge Orders    Future Orders Please Complete By Expires   Diet general      Discharge instructions      Comments:   Mostly bedrest. Get up 9 or 10 times each day and walk for 15-20 minutes each time. Very little sitting the first week. No riding in the car until your first post op appointment. If you had neck surgery...may shower from the chest down. If you had low back surgery....you may shower with a saran wrap covering over the incision. Take your pain medicine as needed...and other medicines that you are instructed to take. Call for an appointment...[phone].   Call MD for:  temperature >100.4      Call MD for:  persistant nausea and vomiting      Call MD for:  severe uncontrolled pain      Call MD for:  redness, tenderness, or signs of infection (pain, swelling, redness, odor or green/yellow discharge around incision site)      Call MD for:  difficulty breathing, headache or visual disturbances      Call MD for:  hives        Medication List  As of 03/02/2012  8:40 AM   TAKE these medications         calcium-vitamin D 500-200 MG-UNIT per tablet   Commonly known as: OSCAL WITH D   Take 1 tablet by mouth daily.        cholecalciferol 1000 UNITS tablet   Commonly known as: VITAMIN D   Take 1,000 Units by mouth daily.      HYDROcodone-acetaminophen 5-325 MG per tablet   Commonly known as: NORCO   Take 1-2 tablets by mouth every 4 (four) hours as needed.      potassium chloride 10 MEQ tablet   Commonly known as: K-DUR,KLOR-CON   Take 10 mEq by mouth daily.      sertraline 100 MG tablet   Commonly known as: ZOLOFT   Take 100 mg by mouth daily.      triamterene-hydrochlorothiazide 37.5-25 MG per tablet   Commonly known as: MAXZIDE-25   Take 1 tablet by mouth daily.      vitamin B-12 1000 MCG tablet   Commonly known as: CYANOCOBALAMIN   Take 1,000 mcg by mouth daily.             At home rest most of the time. Get up 9 or 10 times each day and take a 15 or 20 minute walk. No riding in the car and to your first postoperative appointment. If you have neck surgery you may shower from the chest down starting on the third postoperative day. If you had back surgery he may start showering on the third postoperative day with saran wrap wrapped around your incisional area 3 times.  After the shower remove the saran wrap. Take pain medicine as needed and other medications as instructed. Call my office for an appointment.  SignedReinaldo Meeker, MD 03/02/2012, 8:40 AM

## 2012-03-03 ENCOUNTER — Encounter (HOSPITAL_COMMUNITY): Payer: Self-pay | Admitting: Neurosurgery

## 2012-05-04 ENCOUNTER — Other Ambulatory Visit: Payer: Self-pay | Admitting: Neurosurgery

## 2012-05-04 DIAGNOSIS — M545 Low back pain: Secondary | ICD-10-CM

## 2012-05-09 ENCOUNTER — Ambulatory Visit
Admission: RE | Admit: 2012-05-09 | Discharge: 2012-05-09 | Disposition: A | Discharge status: Home / Self Care | Payer: Medicare Other | Source: Ambulatory Visit | Attending: Neurosurgery | Admitting: Neurosurgery

## 2012-05-09 DIAGNOSIS — M545 Low back pain: Secondary | ICD-10-CM

## 2012-05-09 MED ORDER — GADOBENATE DIMEGLUMINE 529 MG/ML IV SOLN
14.0000 mL | Freq: Once | INTRAVENOUS | Status: AC | PRN
  Administered 2012-05-09: 14 mL via INTRAVENOUS

## 2012-05-10 ENCOUNTER — Other Ambulatory Visit: Payer: Medicare Other

## 2016-04-02 ENCOUNTER — Other Ambulatory Visit: Payer: Self-pay | Admitting: Orthopaedic Surgery

## 2016-04-02 DIAGNOSIS — M545 Low back pain: Principal | ICD-10-CM

## 2016-04-02 DIAGNOSIS — M4716 Other spondylosis with myelopathy, lumbar region: Secondary | ICD-10-CM

## 2016-04-02 DIAGNOSIS — G8929 Other chronic pain: Secondary | ICD-10-CM

## 2016-11-13 ENCOUNTER — Other Ambulatory Visit: Payer: Self-pay | Admitting: Orthopaedic Surgery

## 2016-11-13 DIAGNOSIS — T84216A Breakdown (mechanical) of internal fixation device of vertebrae, initial encounter: Secondary | ICD-10-CM

## 2016-11-13 DIAGNOSIS — M960 Pseudarthrosis after fusion or arthrodesis: Secondary | ICD-10-CM

## 2016-11-16 ENCOUNTER — Ambulatory Visit
Admission: RE | Admit: 2016-11-16 | Discharge: 2016-11-16 | Disposition: A | Discharge status: Home / Self Care | Payer: Medicare Other | Source: Ambulatory Visit | Attending: Orthopaedic Surgery | Admitting: Orthopaedic Surgery

## 2016-11-16 DIAGNOSIS — T84216A Breakdown (mechanical) of internal fixation device of vertebrae, initial encounter: Secondary | ICD-10-CM

## 2016-11-16 DIAGNOSIS — M960 Pseudarthrosis after fusion or arthrodesis: Secondary | ICD-10-CM

## 2019-04-13 ENCOUNTER — Other Ambulatory Visit: Payer: Self-pay | Admitting: Orthopaedic Surgery

## 2019-04-13 DIAGNOSIS — T84216D Breakdown (mechanical) of internal fixation device of vertebrae, subsequent encounter: Secondary | ICD-10-CM

## 2019-04-13 DIAGNOSIS — M4325 Fusion of spine, thoracolumbar region: Secondary | ICD-10-CM

## 2019-04-13 DIAGNOSIS — M542 Cervicalgia: Secondary | ICD-10-CM

## 2019-04-14 ENCOUNTER — Telehealth: Payer: Self-pay | Admitting: Nurse Practitioner

## 2019-04-14 NOTE — Telephone Encounter (Signed)
Phone call to patient to verify medication list and allergies for myelogram procedure. Pt instructed to hold Zoloft for 48hrs prior to myelogram appointment time. Pt verbalized understanding. Pre and post procedure instructions reviewed with pt. 

## 2019-04-26 ENCOUNTER — Ambulatory Visit
Admission: RE | Admit: 2019-04-26 | Discharge: 2019-04-26 | Disposition: A | Discharge status: Home / Self Care | Payer: Medicare Other | Source: Ambulatory Visit | Attending: Orthopaedic Surgery | Admitting: Orthopaedic Surgery

## 2019-04-26 ENCOUNTER — Other Ambulatory Visit: Payer: Self-pay

## 2019-04-26 DIAGNOSIS — T84216D Breakdown (mechanical) of internal fixation device of vertebrae, subsequent encounter: Secondary | ICD-10-CM

## 2019-04-26 DIAGNOSIS — M4325 Fusion of spine, thoracolumbar region: Secondary | ICD-10-CM

## 2019-04-26 DIAGNOSIS — M542 Cervicalgia: Secondary | ICD-10-CM

## 2019-04-26 MED ORDER — IOPAMIDOL (ISOVUE-M 300) INJECTION 61%
10.0000 mL | Freq: Once | INTRAMUSCULAR | Status: AC | PRN
  Administered 2019-04-26: 10 mL via INTRATHECAL

## 2019-04-26 MED ORDER — DIAZEPAM 5 MG PO TABS
5.0000 mg | ORAL_TABLET | Freq: Once | ORAL | Status: AC
  Administered 2019-04-26: 5 mg via ORAL

## 2019-04-26 NOTE — Discharge Instructions (Signed)

## 2020-12-14 IMAGING — CT CT THORACIC SPINE WITH CONTRAST
1 series · 10 of 14 positions shown, 13 images · non-contrast
Comparison: Noncontrast thoracic and lumbar spine CT 11/16/2016.
Cervical, thoracic, and lumbar spine MRI 04/08/2016.

CLINICAL DATA: Cervicalgia. Thoracolumbar fusion. Right upper
extremity weakness. Low back bilateral lower extremity.
TECHNIQUE: Contiguous axial images were obtained through the Cervical,
Thoracic, and Lumbar spine after the intrathecal infusion of
infusion. Coronal and sagittal reconstructions were obtained of the
axial image sets.

[Series 3: t spine soft (person_name) · axial · 0.33mm/px · z∈[+430,+694]mm · 10 of 105 slices shown, 13 images]
[im 9/105  soft-tissue]
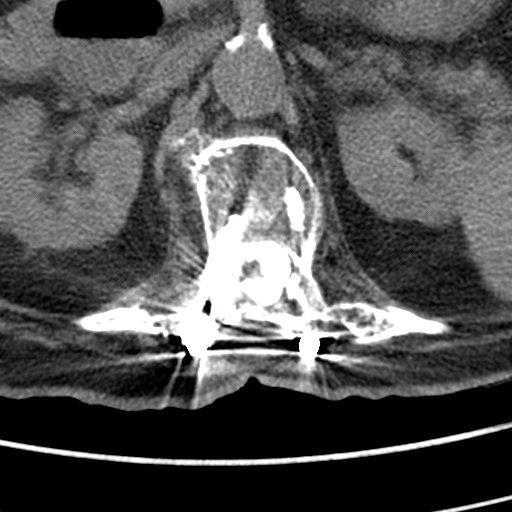
[im 9/105  bone]
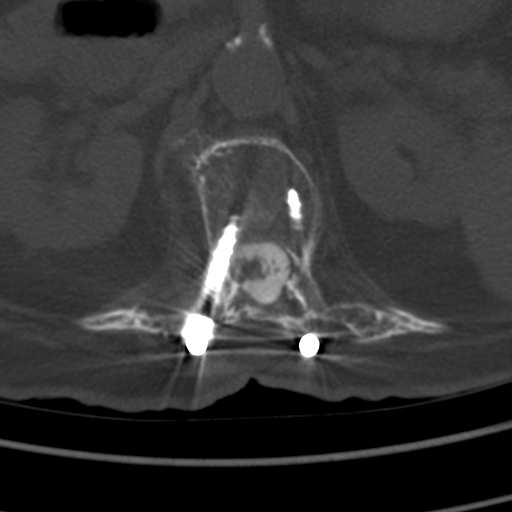
[im 17/105  bone]
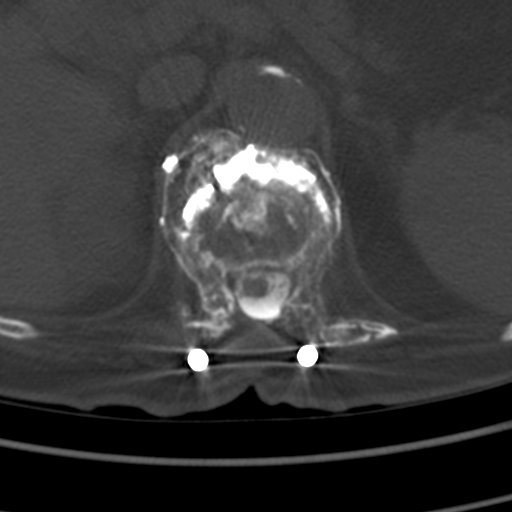
[im 33/105  bone]
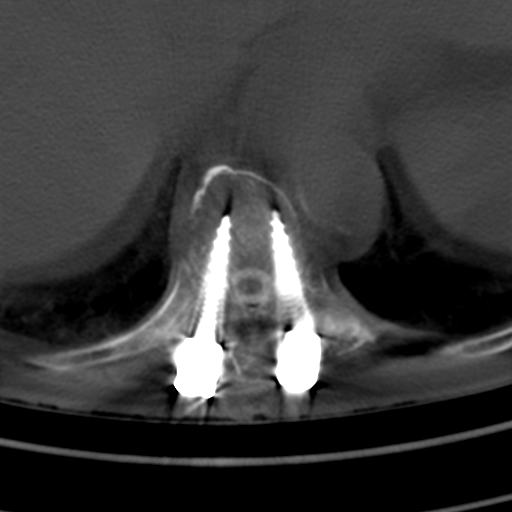
[im 41/105  bone]
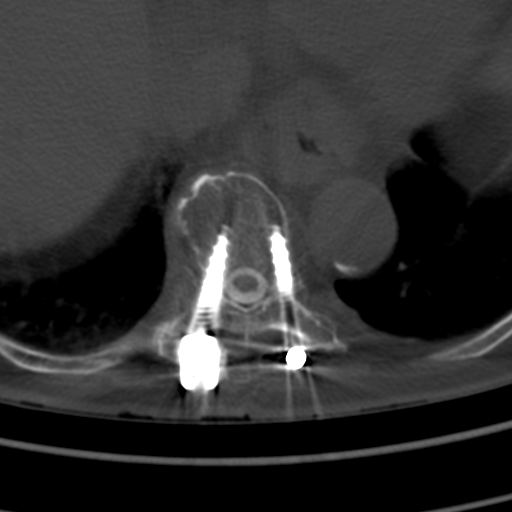
[im 49/105  soft-tissue]
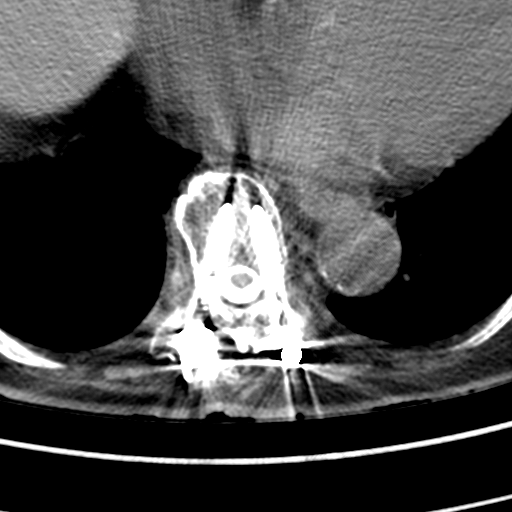
[im 49/105  bone]
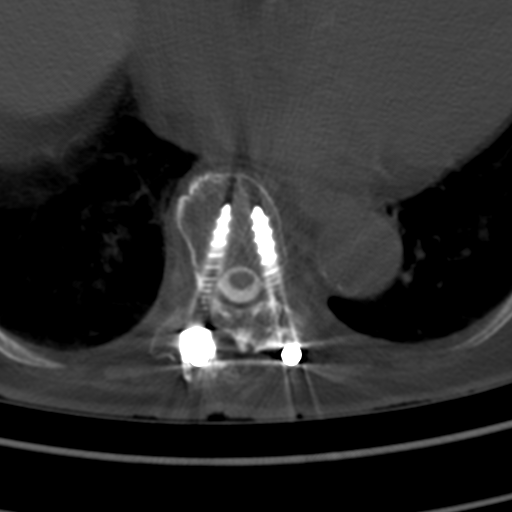
[im 57/105  bone]
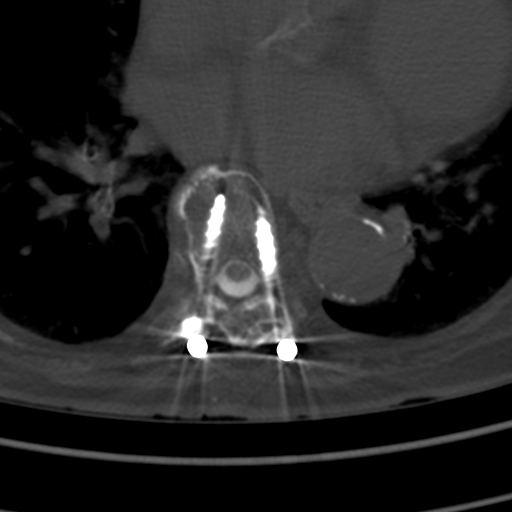
[im 65/105  bone]
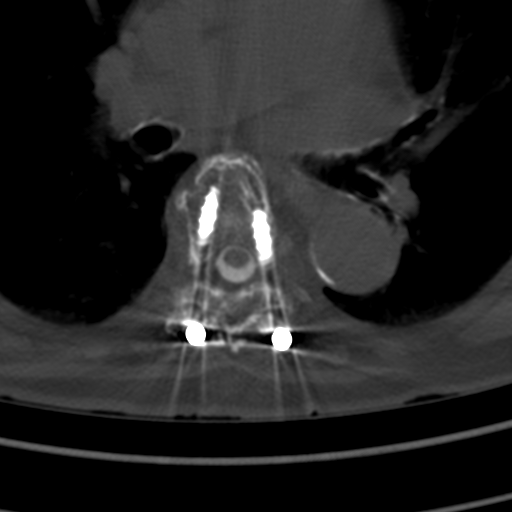
[im 81/105  bone]
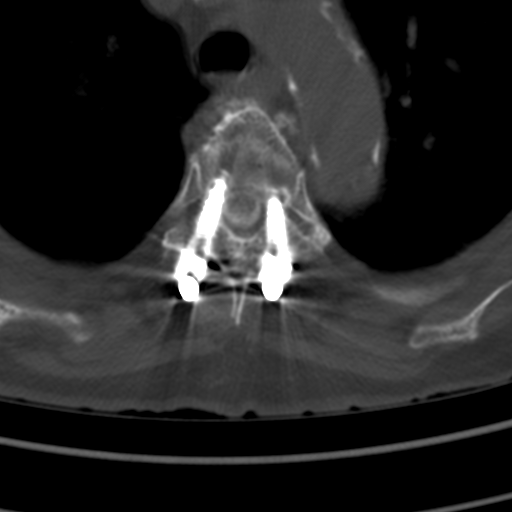
[im 89/105  soft-tissue]
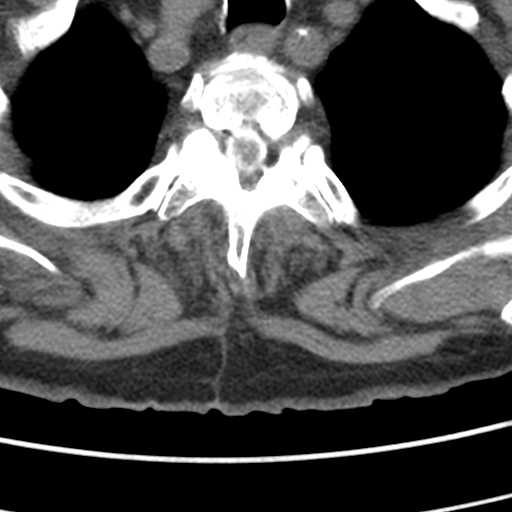
[im 89/105  bone]
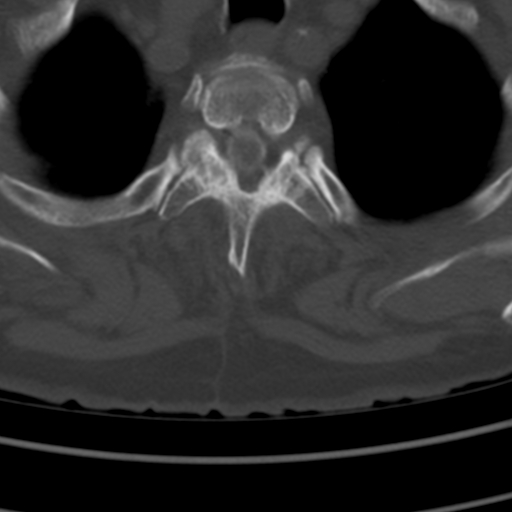
[im 97/105  bone]
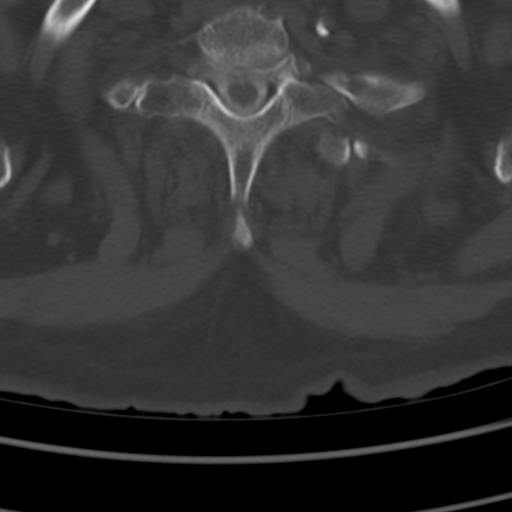

[10 of 14 positions shown; findings below may reference images not displayed]

FLUOROSCOPY TIME:  Fluoroscopy Time: 53 seconds

Radiation Exposure Index: 576.41 microGray*m^2

PROCEDURE:
LUMBAR PUNCTURE FOR CERVICAL LUMBAR AND THORACIC MYELOGRAM

CERVICAL AND LUMBAR AND THORACIC MYELOGRAM

CT CERVICAL MYELOGRAM

CT LUMBAR MYELOGRAM

CT THORACIC MYELOGRAM

After thorough discussion of risks and benefits of the procedure
including bleeding, infection, injury to nerves, blood vessels,
adjacent structures as well as headache and CSF leak, written and
oral informed consent was obtained. Consent was obtained by Dr.
Fallon Jim.

Patient was positioned prone on the fluoroscopy table. Local
anesthesia was provided with 1% lidocaine without epinephrine after
prepped and draped in the usual sterile fashion. Puncture was
performed at L3-4 using a 3 1/2 inch 22-gauge spinal needle via a
left paramedian approach. Using a single pass through the dura, the
needle was placed within the thecal sac, with return of clear CSF.
10 mL Isovue [id number] was injected into the thecal sac, with normal
opacification of the nerve roots and cauda equina consistent with
free flow within the subarachnoid space. The patient was then moved
to the trendelenburg position and contrast flowed into the Thoracic
and Cervical spine regions.

I personally performed the lumbar puncture and administered the
intrathecal contrast. I also personally supervised acquisition of
the myelogram images.
FINDINGS: CERVICAL, THORACIC, AND LUMBAR MYELOGRAM FINDINGS:

A ventral extradural defect is present at C3-4 with at least mild
spinal stenosis. Small ventral extradural defects are also present
at C4-5, C5-6, and C6-7 resulting in up to mild spinal stenosis.

Sequelae of posterior thoracolumbar fusion are identified extending
from T4-S1. The interconnecting rods are fractured on the left just
below the L4 screw and on the right below the L5 screw. There is
grade 1 anterolisthesis of T2 on T3 with advanced disc degeneration.
Superimposed structures and poor visualization of contrast material
at this level limits assessment for stenosis on the conventional
myelographic images with detailed evaluation deferred to CT. There
is a chronic T12 vertebral fracture with retropulsion indenting the
thecal sac with previous posterior decompression at this level. The
thecal sac appears widely patent in the lumbar spine.

CT CERVICAL MYELOGRAM FINDINGS:

There is trace anterolisthesis of C2 on C3, unchanged from the prior
MRI. No fracture or suspicious osseous lesion is identified. There
is moderate disc space narrowing at C4-5 and C5-6 with mild
narrowing at C3-4 and C6-7. Associated degenerative endplate changes
are present at these levels. Mild calcified atherosclerosis is noted
at the carotid bifurcations, and there is also aortic
atherosclerosis. The visualized lung apices are clear.

C2-3: Similar appearance of mild right and severe left facet
arthrosis and mild uncovertebral spurring compared to the prior MRI.
No significant stenosis.

C3-4: Disc bulging, uncovertebral spurring, and mild facet arthrosis
result in mild spinal stenosis and borderline to mild left neural
foraminal stenosis, unchanged.

C4-5: Broad-based posterior disc osteophyte complex results in mild
spinal stenosis and moderate bilateral neural foraminal stenosis,
unchanged.

C5-6: Broad-based posterior disc osteophyte complex results in mild
spinal stenosis and moderate to severe bilateral neural foraminal
stenosis with potential bilateral C6 nerve root impingement,
unchanged.

C6-7: Broad-based posterior disc osteophyte complex results in mild
spinal stenosis and moderate bilateral neural foraminal stenosis,
unchanged.

C7-T1: Mild uncovertebral spurring and moderate right and mild left
facet arthrosis without stenosis, unchanged.

CT THORACIC MYELOGRAM FINDINGS:

3 mm anterolisthesis of T2 on T3 and trace anterolisthesis of T3 on
T4 are unchanged from the prior CT. Sequelae of posterior
thoracolumbar fusion are again identified extending from T4 into the
lumbar spine. Pedicle screws remain in place bilaterally from T4-T11
without screws at T12 where there is an unchanged chronic vertebral
fracture status post augmentation with 7 mm retropulsion and
previous posterior decompression. There is solid posterior osseous
fusion extending from T4 into the lumbar spine, and there is also
posterior element ankylosis at T3-4. There are also solid bridging
right anterolateral vertebral osteophytes from T4-L1. Aortic and
coronary artery atherosclerosis are noted. There has been previous
cholecystectomy. There is a small to moderate-sized sliding hiatal
hernia.

T1-2: Mild endplate spurring without stenosis, unchanged.

T2-3: Progressive disc degeneration with severe disc space
narrowing, degenerative endplate irregularity and sclerosis, and
vacuum disc. Anterolisthesis with bulging uncovered disc, endplate
spurring, and severe facet arthrosis result in mild spinal stenosis
with mild cord flattening and severe right and moderate left neural
foraminal stenosis with potential right T2 nerve root impingement.

T3-4: Trace anterolisthesis with minimal disc uncovering. Posterior
element ankylosis. No stenosis.

T4-5 through T10-11: Posterior fusion.  No stenosis.

T11-12: Chronic T12 compression fracture with retropulsion indenting
the ventral thecal sac. No spinal stenosis following posterior
decompression. Mild chronic osseous neural foraminal narrowing on
the right.

T12-L1: T12 retropulsion.  No stenosis following decompression.

CT LUMBAR MYELOGRAM FINDINGS:

There are 5 non rib-bearing lumbar type vertebrae. Minimal
anterolisthesis of L3 on L4 measures 2 mm, unchanged. Mild anterior
wedging of the L2 vertebral body is unchanged. No suspicious osseous
lesion is identified.

Posterior fusion extends from the thoracic spine to S1. Lucency
about both S1 screws is similar to the prior CT, however lucency
surrounding the left L5 screw has increased, and there is a
nondisplaced fracture of the left L5 pedicle which is likely
chronic. The interconnecting rods are fractured on the right at
L5-S1 and on the left at L4-5. There is solid posterior osseous
fusion extending from the lower thoracic spine to S1. Chronic
lucency about both iliac screws was incompletely imaged today. Solid
posterior left sacroiliac osseous fusion was partially imaged.

Aortic atherosclerosis is noted. A 3.3 cm low-density lesion arising
from the upper pole of the right kidney likely represents a cyst.
Contrast material is noted in the renal collecting systems and
proximal ureters.

L1-2: Previous fusion. Mild disc bulging and endplate spurring
without stenosis.

L2-3: Previous fusion. Mild disc bulging and endplate spurring
without stenosis.

L3-4: Previous fusion. Anterolisthesis with mild bulging of
uncovered disc and mild spurring result in borderline to mild left
neural foraminal stenosis without spinal stenosis, unchanged.

L4-5: Previous fusion. Disc bulging and endplate spurring result in
mild right neural foraminal stenosis without significant spinal
stenosis, unchanged.

L5-S1: Increased vacuum disc with new L5 inferior endplate Schmorl's
node compared to the prior CT. Anterior disc space widening. Disc
bulging, endplate spurring, and facet spurring result in increased,
moderate to severe right and moderate left neural foraminal stenosis
with potential bilateral L5 nerve root impingement.
IMPRESSION: 1. Similar appearance of cervical disc and facet degeneration
compared to the 2533 MRI.
2. Mild spinal stenosis at from C3-4 through C6-7.
3. Moderate to severe bilateral neural foraminal stenosis at C5-6
with moderate foraminal stenosis at C4-5 and C6-7.
4. Severe T2-3 facet arthrosis with grade 1 anterolisthesis and
progressive severe disc degeneration. Mild spinal stenosis and
severe right and moderate left neural foraminal stenosis.
5. Chronic T12 compression fracture with retropulsion. No
significant stenosis following posterior decompression.
6. Solid T4-S1 instrumented posterior fusion.
7. Unchanged lucency about the S1 screws but increased lucency
surrounding the left L5 screw suggesting loosening with a new
chronic appearing left L5 pedicle fracture. Increased vacuum disc at
L5-S1 with anterior disc space widening.
8. Increased, moderate to severe right and moderate left neural
foraminal stenosis at L5-S1.
9. No lumbar spinal stenosis.
